# Patient Record
Sex: Female | Born: 1970 | Race: Black or African American | Hispanic: No | Marital: Married | Smoking: Never smoker
Health system: Southern US, Community
[De-identification: ages and names within clinical notes are randomized; demographics above are authoritative.]

## PROBLEM LIST (undated history)

## (undated) DIAGNOSIS — D649 Anemia, unspecified: Secondary | ICD-10-CM

---

## 2008-04-22 ENCOUNTER — Ambulatory Visit: Payer: Self-pay | Admitting: Family

## 2008-07-07 ENCOUNTER — Ambulatory Visit: Payer: Self-pay | Admitting: Gastroenterology

## 2009-02-11 ENCOUNTER — Emergency Department: Payer: Self-pay | Admitting: Emergency Medicine

## 2010-04-26 ENCOUNTER — Emergency Department: Payer: Self-pay | Admitting: Internal Medicine

## 2010-07-05 ENCOUNTER — Emergency Department: Payer: Self-pay | Admitting: Internal Medicine

## 2010-09-07 ENCOUNTER — Ambulatory Visit: Payer: Self-pay | Admitting: Family Medicine

## 2011-06-11 IMAGING — CR DG CHEST 2V
1 series · 2 of 2 positions shown · non-contrast
Comparison: none

REASON FOR EXAM: cp
COMMENTS:

[Series 1: view not recorded · 0.17mm/px · 2 of 2 slices shown]
[im 1/2]
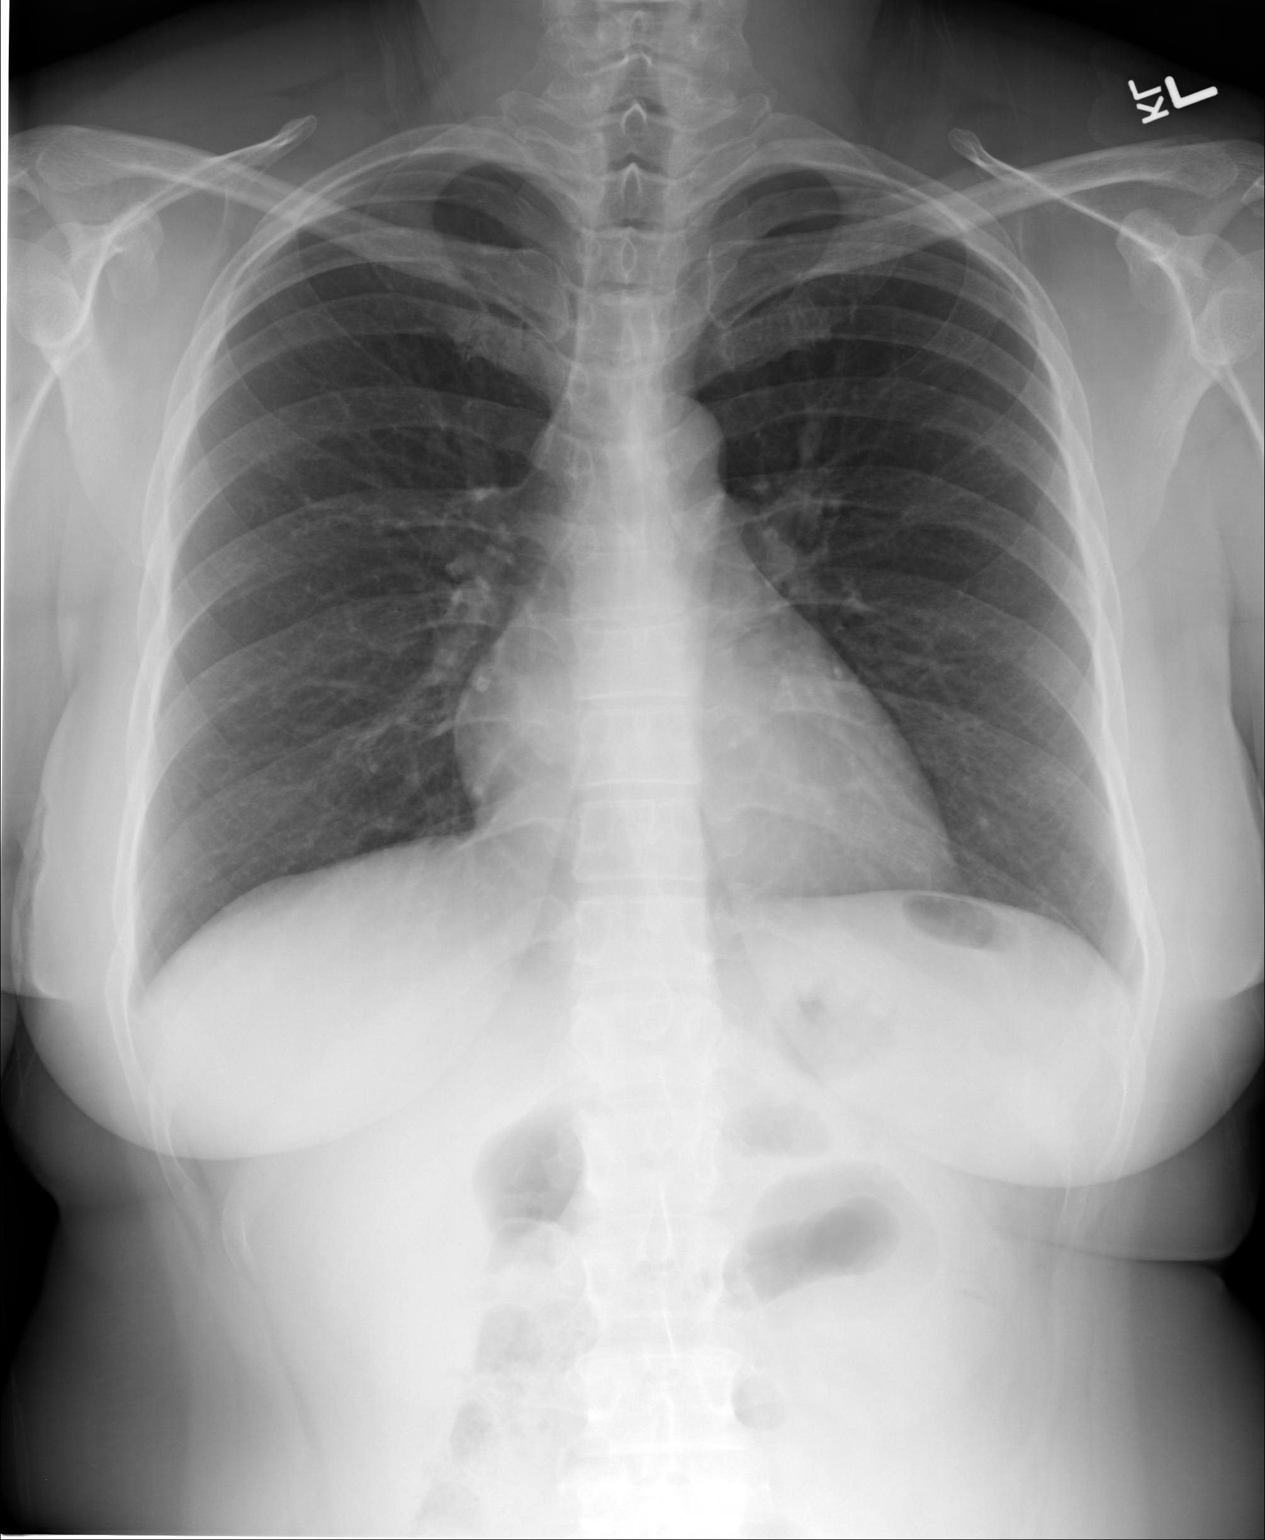
[im 2/2]
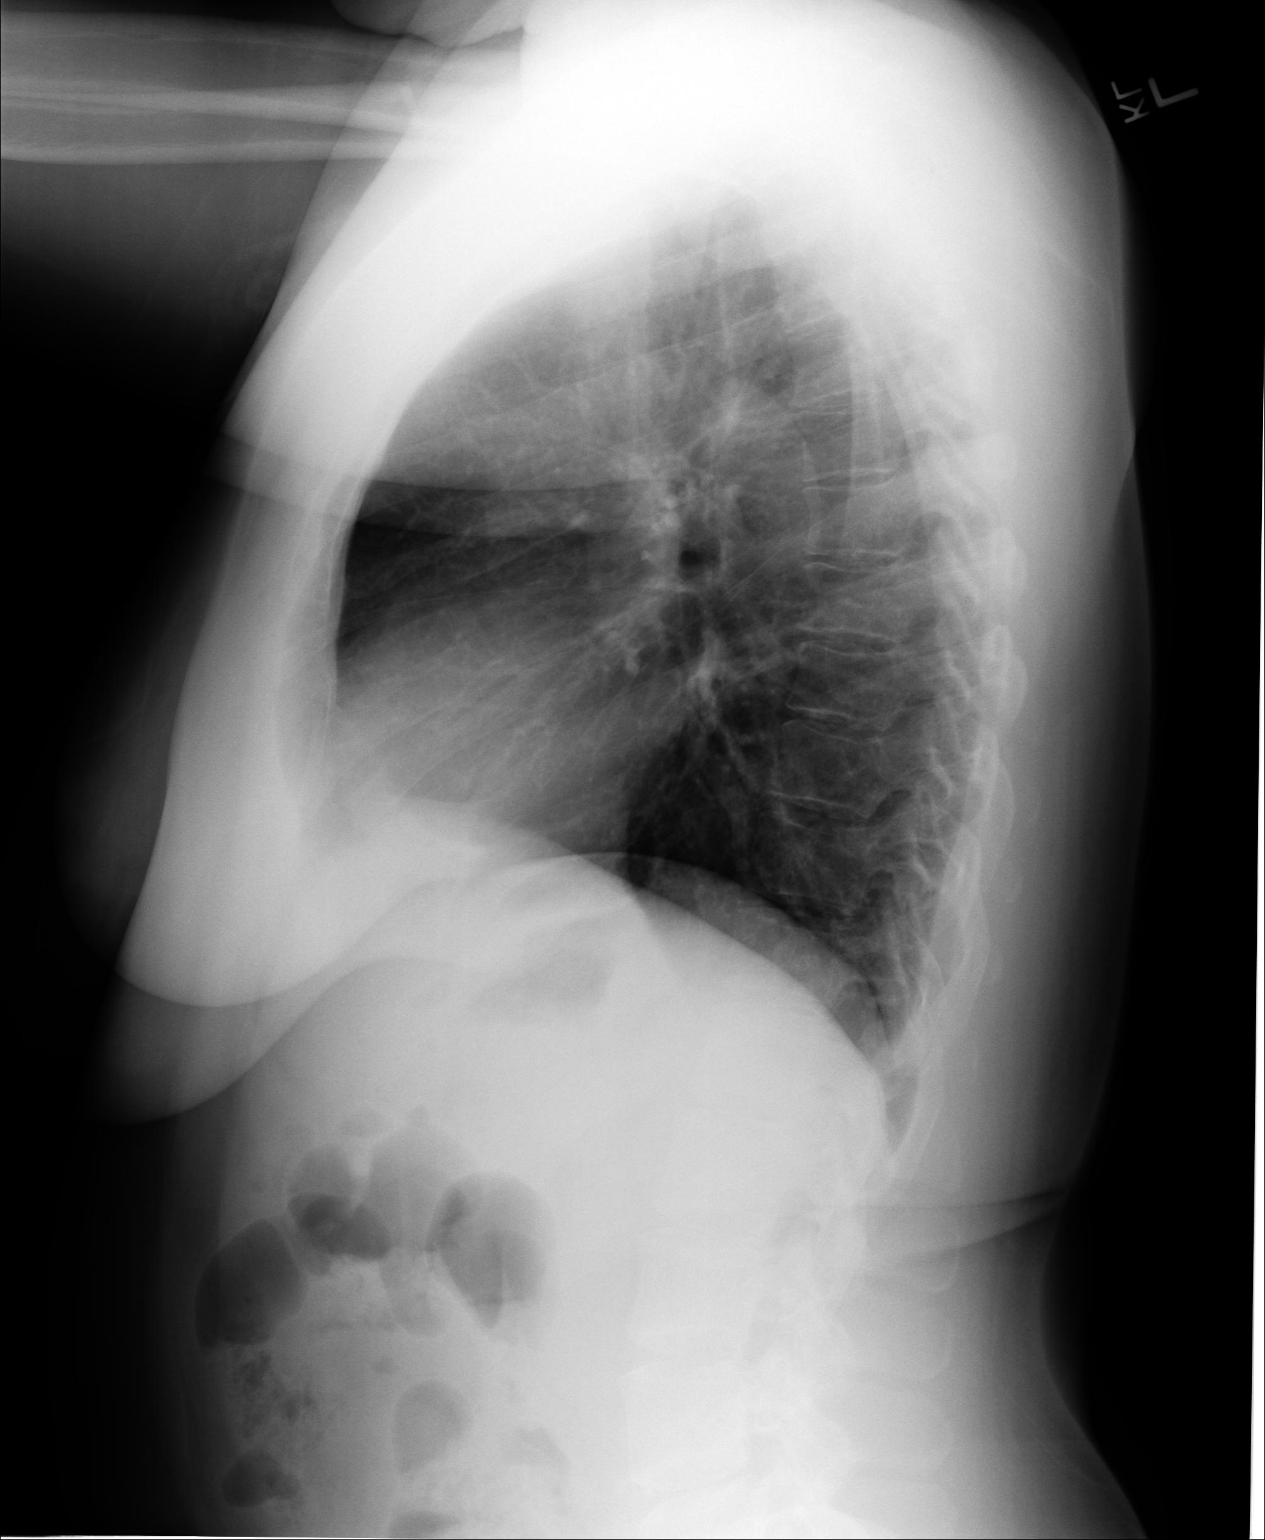

[2 of 2 positions shown; findings below may reference images not displayed]

PROCEDURE:     DXR - DXR CHEST PA (OR AP) AND LATERAL  - July 05, 2010  [DATE]

RESULT:     Comparison is made to the study 26 April, 2010.

The lungs are well-expanded and clear. The heart and mediastinal structures
are within the limits of normal. There is no evidence of a pneumothorax or
pneumomediastinum or pleural effusion. The bony thorax exhibits no acute
abnormality.
IMPRESSION: I do not see evidence of acute cardiopulmonary abnormality.

## 2011-08-14 IMAGING — CR RIGHT ANKLE - COMPLETE 3+ VIEW
1 series · 5 of 5 positions shown · non-contrast
Comparison: none

REASON FOR EXAM: bilateral ankle pain
COMMENTS:

PROCEDURE:     KDR - KDXR ANKLE RIGHT COMPLETE  - September 07, 2010  [DATE]
RESULT:     No fracture, dislocation or other acute bony abnormality is
identified. The ankle mortise is well maintained. No arthritic change about
the ankle is seen. No soft tissue foreign body is noted.

[Series 1: view not recorded · 0.17mm/px · 5 of 5 slices shown]
[im 1/5]
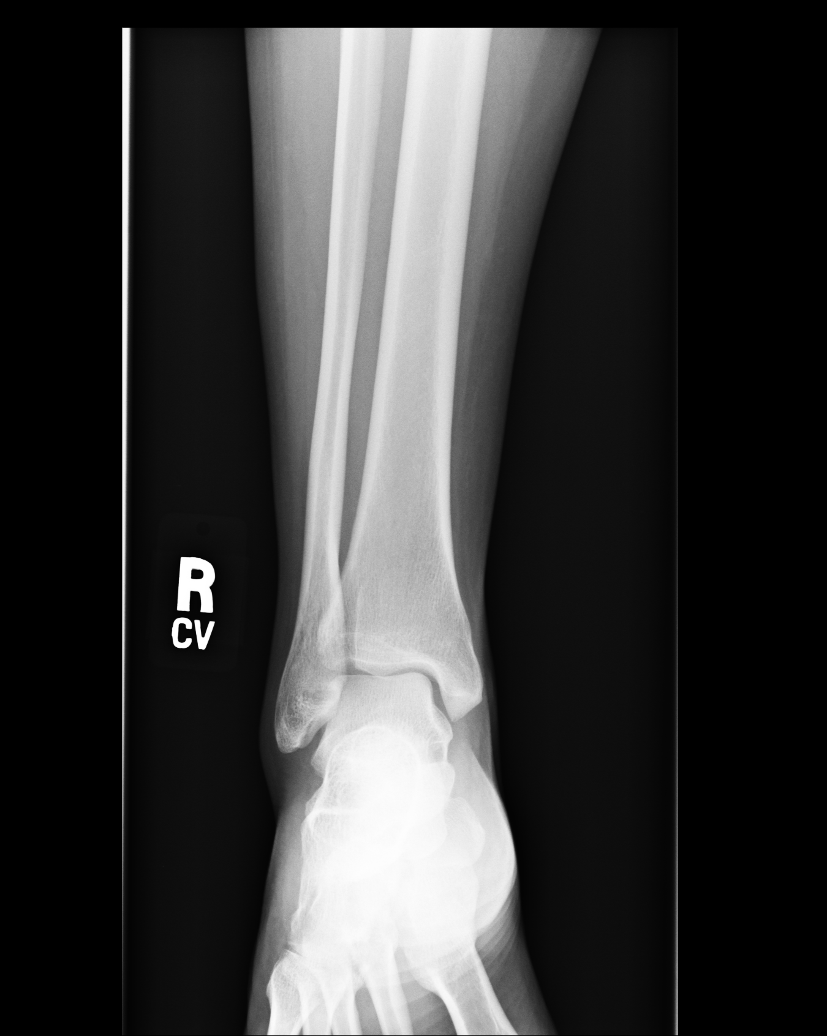
[im 2/5]
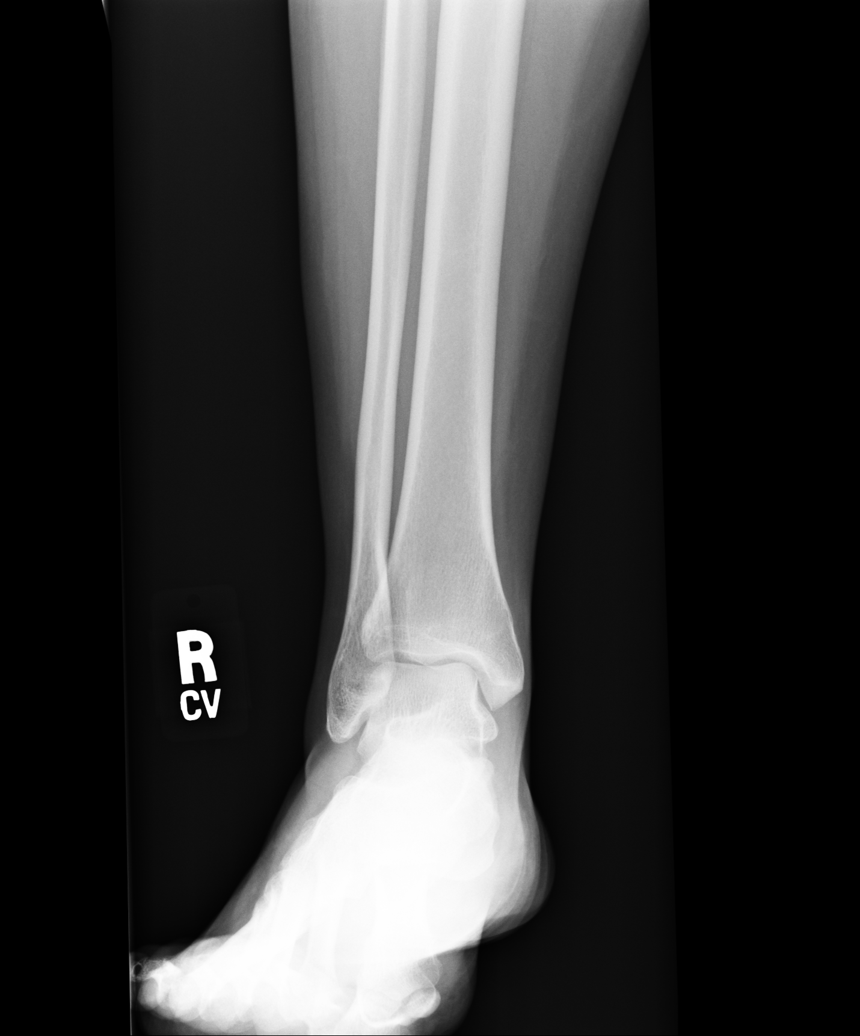
[im 3/5]
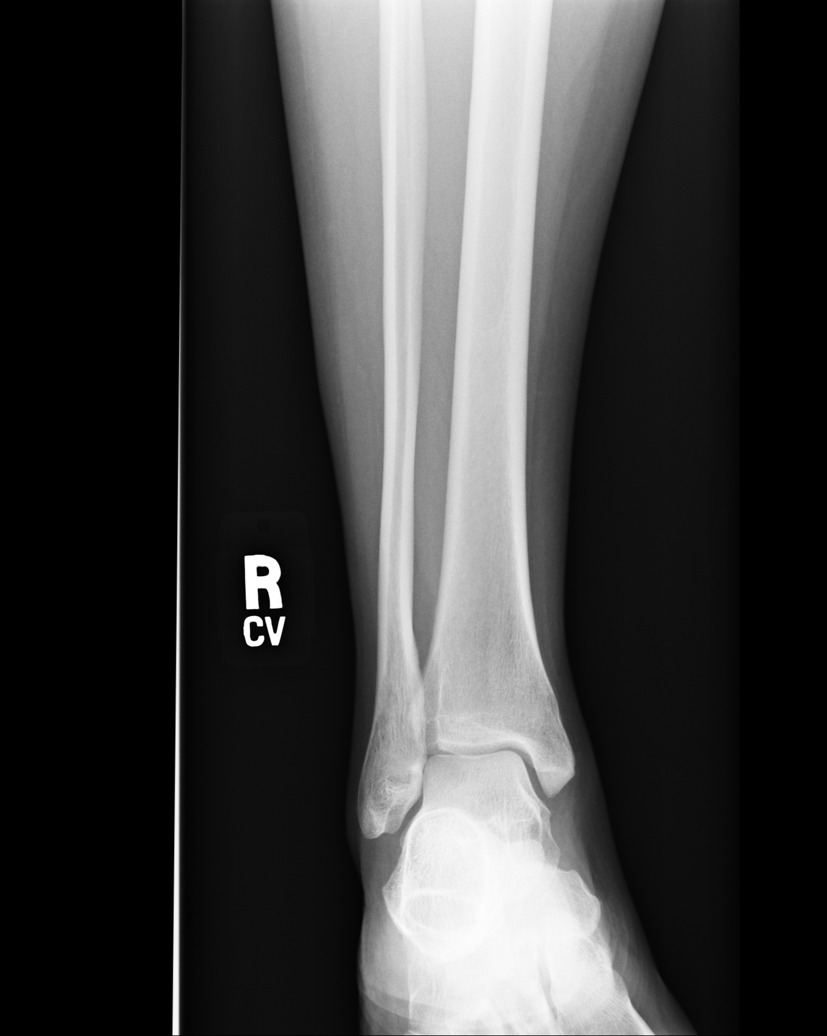
[im 4/5]
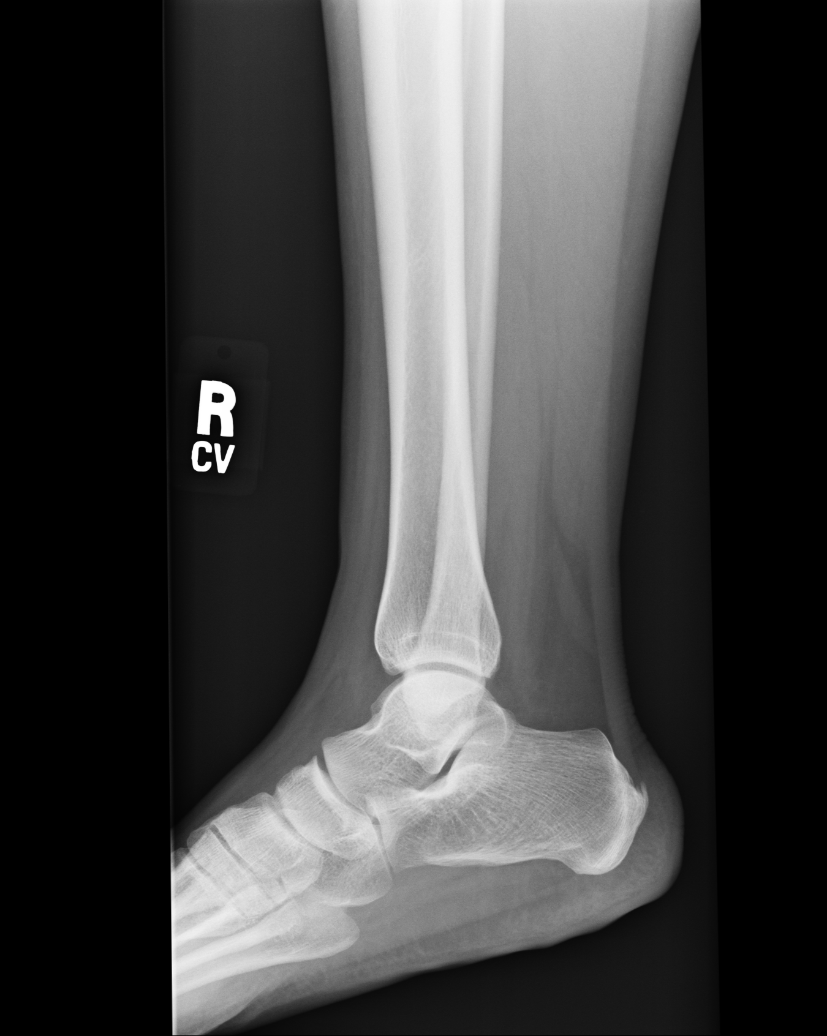
[im 5/5]
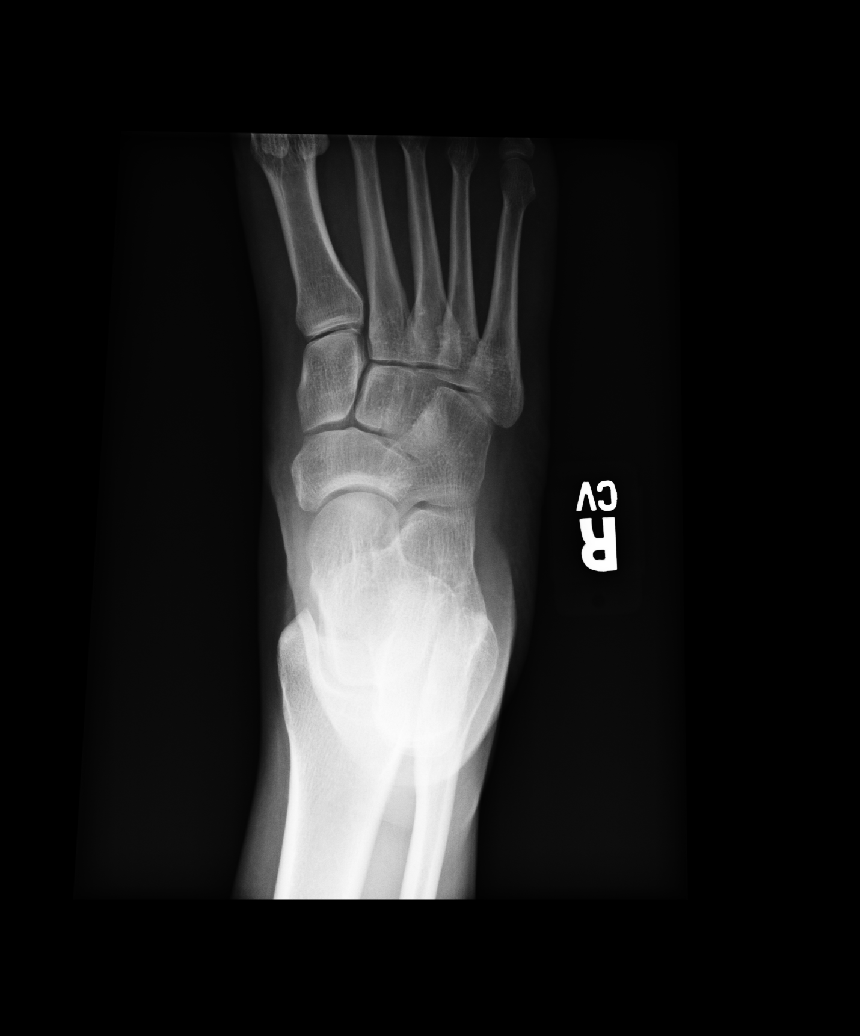

[5 of 5 positions shown; findings below may reference images not displayed]

IMPRESSION: 1.     No significant abnormalities are noted.

## 2020-04-06 ENCOUNTER — Encounter: Payer: Self-pay | Admitting: Emergency Medicine

## 2020-04-06 ENCOUNTER — Emergency Department

## 2020-04-06 ENCOUNTER — Other Ambulatory Visit: Payer: Self-pay

## 2020-04-06 DIAGNOSIS — R0789 Other chest pain: Secondary | ICD-10-CM | POA: Insufficient documentation

## 2020-04-06 LAB — CBC
HCT: 32.6 % — ABNORMAL LOW (ref 36.0–46.0)
Hemoglobin: 11 g/dL — ABNORMAL LOW (ref 12.0–15.0)
MCH: 27.5 pg (ref 26.0–34.0)
MCHC: 33.7 g/dL (ref 30.0–36.0)
MCV: 81.5 fL (ref 80.0–100.0)
Platelets: 320 10*3/uL (ref 150–400)
RBC: 4 MIL/uL (ref 3.87–5.11)
RDW: 12.5 % (ref 11.5–15.5)
WBC: 9.5 10*3/uL (ref 4.0–10.5)
nRBC: 0 % (ref 0.0–0.2)

## 2020-04-06 NOTE — ED Triage Notes (Signed)
Pt to ED from home c/o mid chest pain that is burning and radiating to left arm that started yesterday afternoon.  Movement makes pain worse, denies SOB or n/v/d.  Has had belching and gas at home.  Taking prilosec at home without relief.  Pt A&Ox4. Chest rise even and unlabored, skin WNL and in NAD at this time.

## 2020-04-07 ENCOUNTER — Emergency Department
Admission: EM | Admit: 2020-04-07 | Discharge: 2020-04-07 | Disposition: A | Discharge status: Home / Self Care | Attending: Emergency Medicine | Admitting: Emergency Medicine

## 2020-04-07 DIAGNOSIS — R0789 Other chest pain: Secondary | ICD-10-CM

## 2020-04-07 HISTORY — DX: Anemia, unspecified: D64.9

## 2020-04-07 LAB — BASIC METABOLIC PANEL
Anion gap: 10 (ref 5–15)
BUN: 10 mg/dL (ref 6–20)
CO2: 24 mmol/L (ref 22–32)
Calcium: 9.1 mg/dL (ref 8.9–10.3)
Chloride: 103 mmol/L (ref 98–111)
Creatinine, Ser: 0.62 mg/dL (ref 0.44–1.00)
GFR, Estimated: >60 mL/min (ref >60)
Glucose, Bld: 131 mg/dL — ABNORMAL HIGH (ref 70–99)
Potassium: 3.5 mmol/L (ref 3.5–5.1)
Sodium: 137 mmol/L (ref 135–145)

## 2020-04-07 LAB — HEPATIC FUNCTION PANEL
ALT: 19 U/L (ref 0–44)
AST: 20 U/L (ref 15–41)
Albumin: 4.1 g/dL (ref 3.5–5.0)
Alkaline Phosphatase: 39 U/L (ref 38–126)
Bilirubin, Direct: <0.1 mg/dL (ref 0.0–0.2)
Total Bilirubin: 0.7 mg/dL (ref 0.3–1.2)
Total Protein: 7.6 g/dL (ref 6.5–8.1)

## 2020-04-07 LAB — LIPASE, BLOOD: Lipase: 40 U/L (ref 11–51)

## 2020-04-07 LAB — TROPONIN I (HIGH SENSITIVITY)
Troponin I (High Sensitivity): 3 ng/L (ref <18)
Troponin I (High Sensitivity): <2 ng/L (ref <18)

## 2020-04-07 MED ORDER — ALUM & MAG HYDROXIDE-SIMETH 200-200-20 MG/5ML PO SUSP
30.0000 mL | Freq: Once | ORAL | Status: AC
  Administered 2020-04-07: 30 mL via ORAL
  Filled 2020-04-07: qty 30

## 2020-04-07 MED ORDER — LIDOCAINE VISCOUS HCL 2 % MT SOLN
15.0000 mL | Freq: Once | OROMUCOSAL | Status: AC
  Administered 2020-04-07: 15 mL via ORAL
  Filled 2020-04-07: qty 15

## 2020-04-07 NOTE — ED Notes (Signed)
Patient discharged to home per MD order. Patient in stable condition, and deemed medically cleared by ED provider for discharge. Discharge instructions reviewed with patient/family using "Teach Back"; verbalized understanding of medication education and administration, and information about follow-up care. Denies further concerns. ° °

## 2020-04-07 NOTE — ED Notes (Signed)
Pt states she thinks her acid refulx is the cause of the pain. Pt states she has a history of the reflux. Pt states the only reason she came in for was because the chest pain did radiate to the left arm for a few moments, but currently does not have arm pain.

## 2020-04-07 NOTE — Discharge Instructions (Addendum)
You have no signs of heart attack or strain/damage to your heart.  Continue to take your Prilosec.   Return to the ED with any worsening chest pains, particularly with fevers or passing out. Return to the ED with any worsening upper belly pain after eating

## 2020-04-07 NOTE — ED Provider Notes (Signed)
[facility] Emergency Department Provider Note ____________________________________________   Event Date/Time   First MD Initiated Contact with Patient 04/07/20 0151     (approximate)  I have reviewed the triage vital signs and the nursing notes.  HISTORY  Chief Complaint Chest Pain   HPI Samantha Spears is a 50 y.o. femalewho presents to the ED for evaluation of chest pain.   Chart review indicates history of obesity and chronic anemia.  No cardiac history.  Self-reports a history of GERD for which she takes omeprazole regularly.  Patient presents to the ED with substernal chest pain that is burning in nature, worse after eating, with associated belching and gaseous bloating sensation.  She reports the pain is worse postprandially, but denies any emesis, diarrhea, RUQ pain, cough, fever, syncope or trauma.  Currently reporting 4/10 intensity burning discomfort.  She reports discussing this with her husband, presents to the ED for evaluation of heart attack.   Past Medical History:  Diagnosis Date  . Anemia     There are no problems to display for this patient.   History reviewed. No pertinent surgical history.  Prior to Admission medications   Not on File    Allergies Asa [aspirin]  History reviewed. No pertinent family history.  Social History Social History   Tobacco Use  . Smoking status: Never Smoker  . Smokeless tobacco: Never Used  Substance Use Topics  . Alcohol use: Yes    Alcohol/week: 5.0 standard drinks    Types: 5 Glasses of wine per week    Comment: friday and saturday  . Drug use: Never    Review of Systems  Constitutional: No fever/chills Eyes: No visual changes. ENT: No sore throat. Cardiovascular: Positive for chest pain. Respiratory: Denies shortness of breath. Gastrointestinal: No abdominal pain.  No nausea, no vomiting.  No diarrhea.  No constipation. Genitourinary: Negative for dysuria. Musculoskeletal:  Negative for back pain. Skin: Negative for rash. Neurological: Negative for headaches, focal weakness or numbness.  ____________________________________________   PHYSICAL EXAM:  VITAL SIGNS: Vitals:   04/07/20 0125 04/07/20 0224  BP: 140/75 124/76  Pulse: 88 83  Resp: 18 20  Temp:    SpO2: 100% 98%    Constitutional: Alert and oriented. Well appearing and in no acute distress. Eyes: Conjunctivae are normal. PERRL. EOMI. Head: Atraumatic. Nose: No congestion/rhinnorhea. Mouth/Throat: Mucous membranes are moist.  Oropharynx non-erythematous. Neck: No stridor. No cervical spine tenderness to palpation. Cardiovascular: Normal rate, regular rhythm. Grossly normal heart sounds.  Good peripheral circulation. Respiratory: Normal respiratory effort.  No retractions. Lungs CTAB. Gastrointestinal: Soft , nondistended, nontender to palpation. No CVA tenderness.  Benign abdomen throughout Musculoskeletal: No lower extremity tenderness nor edema.  No joint effusions. No signs of acute trauma. Neurologic:  Normal speech and language. No gross focal neurologic deficits are appreciated. No gait instability noted. Skin:  Skin is warm, dry and intact. No rash noted. Psychiatric: Mood and affect are normal. Speech and behavior are normal. ____________________________________________   LABS (all labs ordered are listed, but only abnormal results are displayed)  Labs Reviewed  BASIC METABOLIC PANEL - Abnormal; Notable for the following components:      Result Value   Glucose, Bld 131 (*)    All other components within normal limits  CBC - Abnormal; Notable for the following components:   Hemoglobin 11.0 (*)    HCT 32.6 (*)    All other components within normal limits  HEPATIC FUNCTION PANEL  LIPASE, BLOOD  POC  URINE PREG, ED  TROPONIN I (HIGH SENSITIVITY)  TROPONIN I (HIGH SENSITIVITY)   ____________________________________________  12 Lead EKG  Sinus rhythm, rate of 95 bpm.  Normal  axis and intervals.  No evidence of acute ischemia. ____________________________________________  RADIOLOGY  ED MD interpretation: 2 view CXR reviewed by me without evidence of acute cardiopulmonary pathology.  Official radiology report(s): DG Chest 2 View  Result Date: 04/06/2020 CLINICAL DATA:  50 year old female with chest pain. EXAM: CHEST - 2 VIEW COMPARISON:  Chest radiograph dated 07/05/2010. FINDINGS: The heart size and mediastinal contours are within normal limits. Both lungs are clear. The visualized skeletal structures are unremarkable. IMPRESSION: No active cardiopulmonary disease. Electronically Signed   By: Elgie Collard M.D.   On: 04/06/2020 22:50    ____________________________________________   PROCEDURES and INTERVENTIONS  Procedure(s) performed (including Critical Care):  .1-3 Lead EKG Interpretation Performed by: Delton Prairie, MD Authorized by: Delton Prairie, MD     Interpretation: normal     ECG rate:  80   ECG rate assessment: normal     Rhythm: sinus rhythm     Ectopy: none     Conduction: normal      Medications  alum & mag hydroxide-simeth (MAALOX/MYLANTA) 200-200-20 MG/5ML suspension 30 mL (30 mLs Oral Given 04/07/20 0208)    And  lidocaine (XYLOCAINE) 2 % viscous mouth solution 15 mL (15 mLs Oral Given 04/07/20 0208)    ____________________________________________   MDM / ED COURSE   50 year old woman without cardiac history presents to the ED with chest pain, likely of GI etiology, and amenable to outpatient management.  Normal vitals on room air.  Exam without evidence of acute pathology.  Benign abdomen without RUQ tenderness to suggest acute cholelithiasis or -cystitis.  Blood work without evidence of acute pathology or biliary obstruction.  EKG is nonischemic and 2 high-sensitivity troponins are negative.  Patient's pain is resolved after GI cocktail.  I urged her to continue her antiacid at home and follow-up with her PCP.  We discussed  return precautions for the ED.   Clinical Course as of 04/07/20 0448  Wed Apr 07, 2020  0221 Reassessed.  Patient reports resolution of symptoms after GI cocktail.  We discussed return precautions for the ED [DS]    Clinical Course User Index [DS] Delton Prairie, MD    ____________________________________________   FINAL CLINICAL IMPRESSION(S) / ED DIAGNOSES  Final diagnoses:  Other chest pain     ED Discharge Orders    None       Teigen Parslow Katrinka Blazing   Note:  This document was prepared using Dragon voice recognition software and may include unintentional dictation errors.   Delton Prairie, MD 04/07/20 [phone]

## 2021-03-13 IMAGING — CR DG CHEST 2V
2 series · 2 of 2 positions shown · non-contrast
Comparison: Chest radiograph dated 07/05/2010.

CLINICAL DATA: 49-year-old female with chest pain.

EXAM:
CHEST - 2 VIEW

[chest pa]
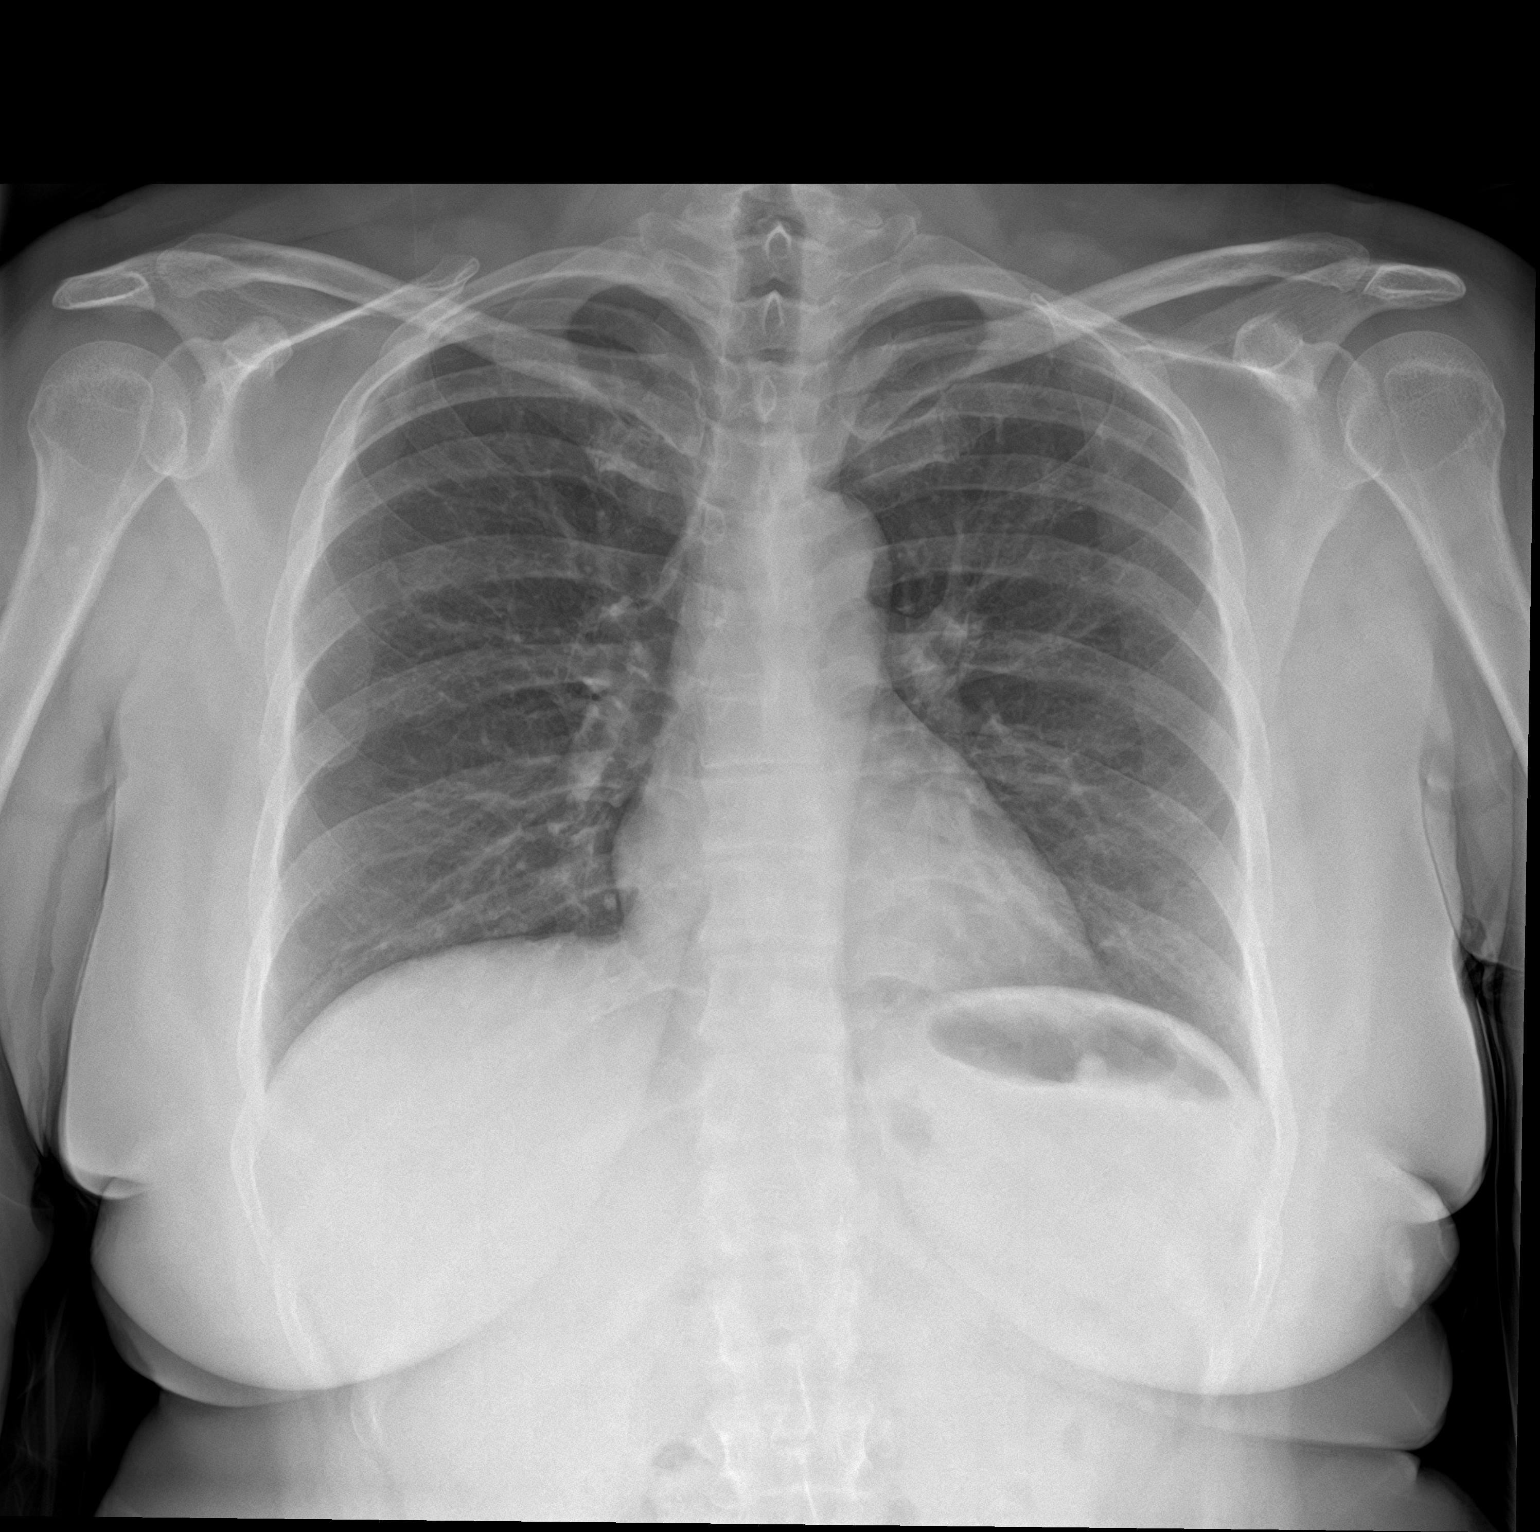

[chest lat]
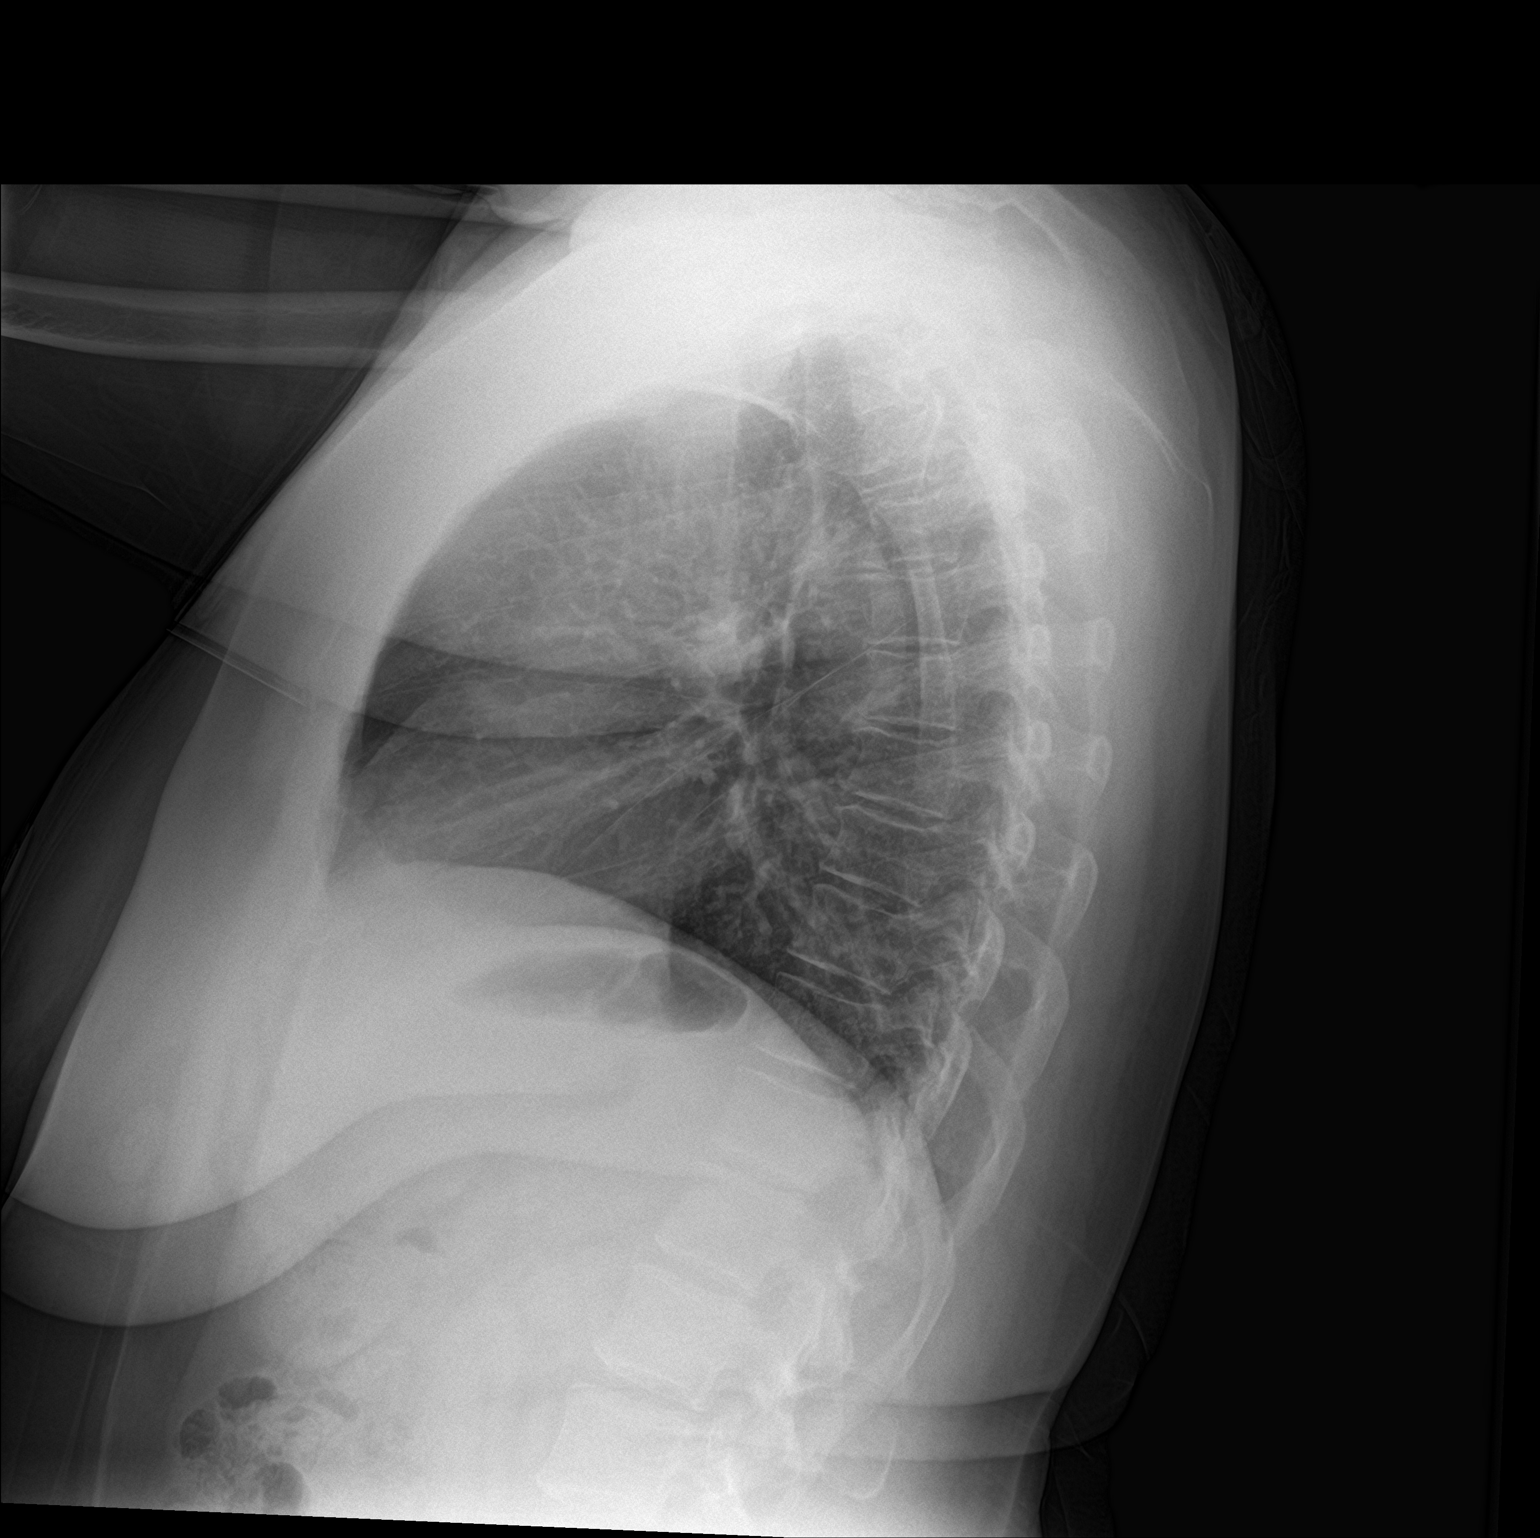

[2 of 2 positions shown; findings below may reference images not displayed]

FINDINGS: The heart size and mediastinal contours are within normal limits.
Both lungs are clear. The visualized skeletal structures are
unremarkable.
IMPRESSION: No active cardiopulmonary disease.

## 2021-05-01 ENCOUNTER — Encounter: Payer: Self-pay | Admitting: Emergency Medicine

## 2021-05-01 ENCOUNTER — Emergency Department
Admission: EM | Admit: 2021-05-01 | Discharge: 2021-05-01 | Disposition: A | Discharge status: Home / Self Care | Attending: Emergency Medicine | Admitting: Emergency Medicine

## 2021-05-01 ENCOUNTER — Other Ambulatory Visit: Payer: Self-pay

## 2021-05-01 DIAGNOSIS — J020 Streptococcal pharyngitis: Secondary | ICD-10-CM | POA: Diagnosis not present

## 2021-05-01 DIAGNOSIS — Z20822 Contact with and (suspected) exposure to covid-19: Secondary | ICD-10-CM | POA: Insufficient documentation

## 2021-05-01 DIAGNOSIS — J029 Acute pharyngitis, unspecified: Secondary | ICD-10-CM | POA: Diagnosis present

## 2021-05-01 LAB — RESP PANEL BY RT-PCR (FLU A&B, COVID) ARPGX2
Influenza A by PCR: NEGATIVE
Influenza B by PCR: NEGATIVE
SARS Coronavirus 2 by RT PCR: NEGATIVE

## 2021-05-01 LAB — GROUP A STREP BY PCR: Group A Strep by PCR: DETECTED — AB

## 2021-05-01 MED ORDER — LIDOCAINE VISCOUS HCL 2 % MT SOLN: 15.0000 mL | OROMUCOSAL | 0 refills | Status: AC | PRN

## 2021-05-01 MED ORDER — ACETAMINOPHEN 500 MG PO TABS
1000.0000 mg | ORAL_TABLET | Freq: Once | ORAL | Status: AC
Start: 2021-05-01 — End: 2021-05-01
  Administered 2021-05-01: 1000 mg via ORAL
  Filled 2021-05-01: qty 2

## 2021-05-01 MED ORDER — AMOXICILLIN 500 MG PO TABS: 500.0000 mg | ORAL_TABLET | Freq: Two times a day (BID) | ORAL | 0 refills | Status: AC

## 2021-05-01 NOTE — ED Provider Notes (Signed)
[facility] Provider Note    None    (approximate)   History   Sore Throat and Fever   HPI  Samantha Spears is a 51 y.o. female with history of anemia and as listed in EMR presents to the emergency department for treatment and evaluation of sore throat fever.  Symptoms started 2 days ago.  Exposures to COVID, influenza, and strep throat while at work.  No alleviating measures attempted prior to arrival.      Physical Exam   Triage Vital Signs: ED Triage Vitals  Enc Vitals Group     BP 05/01/21 0827 121/84     Pulse Rate 05/01/21 0827 (!) 139     Resp 05/01/21 0827 20     Temp 05/01/21 0827 (!) 101.7 F (38.7 C)     Temp Source 05/01/21 0827 Oral     SpO2 05/01/21 0827 95 %     Weight 05/01/21 0824 176 lb 5.9 oz (80 kg)     Height 05/01/21 0824 5\' 2"  (1.575 m)     Head Circumference --      Peak Flow --      Pain Score 05/01/21 0824 6     Pain Loc --      Pain Edu? --      Excl. in [place]? --     Most recent vital signs: Vitals:   05/01/21 0941 05/01/21 1016  BP: 120/80 132/77  Pulse: (!) 129 (!) 123  Resp: 18 20  Temp:  99.7 F (37.6 C)  SpO2: 96% 95%    General: Awake, no distress.  CV:  Good peripheral perfusion.  Resp:  Normal effort.  Abd:  No distention.  Other:  Posterior oropharynx erythematous.  Unable to visualize tonsils.  Uvula is midline.  No airway edema.  Patient tolerating secretions.   ED Results / Procedures / Treatments   Labs (all labs ordered are listed, but only abnormal results are displayed) Labs Reviewed  GROUP A STREP BY PCR - Abnormal; Notable for the following components:      Result Value   Group A Strep by PCR DETECTED (*)    All other components within normal limits  RESP PANEL BY RT-PCR (FLU A&B, COVID) [id number]     EKG  Not indicated   RADIOLOGY  Image and radiology report reviewed by me.  Not indicated  PROCEDURES:  Critical Care performed: No  Procedures   MEDICATIONS  ORDERED IN ED: Medications  acetaminophen (TYLENOL) tablet 1,000 mg (1,000 mg Oral Given 05/01/21 0940)     IMPRESSION / MDM / ASSESSMENT AND PLAN / ED COURSE   I have reviewed the triage note.  Differential diagnosis includes, but is not limited to, COVID, influenza, strep, viral syndrome.  51 year old female presenting to the emergency department for treatment and evaluation of sore throat and fever.  See HPI for further details.  Tachycardic and febrile on arrival.  Influenza and COVID screening is negative.  Strep PCR is positive.  Tachycardia and fever has decreased after Tylenol.  Patient would like to be discharged home.  She will be prescribed amoxicillin and viscous lidocaine.  Work excuse provided for the next 24 hours.  She is to follow-up with her primary care provider or return to the emergency department for symptoms of change or worsen.      FINAL CLINICAL IMPRESSION(S) / ED DIAGNOSES   Final diagnoses:  Strep pharyngitis     Rx / DC Orders   ED  Discharge Orders          Ordered    amoxicillin (AMOXIL) 500 MG tablet  2 times daily        05/01/21 0933    lidocaine (XYLOCAINE) 2 % solution  As needed        05/01/21 [id number]             Note:  This document was prepared using Dragon voice recognition software and may include unintentional dictation errors.   Victorino Dike, FNP 05/01/21 1855    Vanessa Rembrandt, MD 05/03/21 1501

## 2021-05-01 NOTE — ED Triage Notes (Signed)
Pt reports started with sore throat and fever Friday. Pt reports pain worse when swallowing. Pt states recent exposure to illnesses at work including strep throat. Pt denies fever this am. Pt denies cough, congestion, sob,or pain.

## 2021-05-01 NOTE — ED Notes (Signed)
See triage note  presents with fever and sore throat  states sore throat since Friday  denies any cough

## 2022-10-02 ENCOUNTER — Emergency Department
Admission: EM | Admit: 2022-10-02 | Discharge: 2022-10-02 | Disposition: A | Discharge status: Home / Self Care | Attending: Emergency Medicine | Admitting: Emergency Medicine

## 2022-10-02 ENCOUNTER — Other Ambulatory Visit: Payer: Self-pay

## 2022-10-02 ENCOUNTER — Emergency Department

## 2022-10-02 DIAGNOSIS — Z1152 Encounter for screening for COVID-19: Secondary | ICD-10-CM | POA: Diagnosis not present

## 2022-10-02 DIAGNOSIS — R0789 Other chest pain: Secondary | ICD-10-CM | POA: Diagnosis not present

## 2022-10-02 DIAGNOSIS — R0602 Shortness of breath: Secondary | ICD-10-CM | POA: Insufficient documentation

## 2022-10-02 LAB — COMPREHENSIVE METABOLIC PANEL
ALT: 26 U/L (ref 0–44)
AST: 33 U/L (ref 15–41)
Albumin: 4.3 g/dL (ref 3.5–5.0)
Alkaline Phosphatase: 49 U/L (ref 38–126)
Anion gap: 8 (ref 5–15)
BUN: 9 mg/dL (ref 6–20)
CO2: 28 mmol/L (ref 22–32)
Calcium: 9.2 mg/dL (ref 8.9–10.3)
Chloride: 103 mmol/L (ref 98–111)
Creatinine, Ser: 0.68 mg/dL (ref 0.44–1.00)
GFR, Estimated: >60 mL/min (ref >60)
Glucose, Bld: 126 mg/dL — ABNORMAL HIGH (ref 70–99)
Potassium: 3.6 mmol/L (ref 3.5–5.1)
Sodium: 139 mmol/L (ref 135–145)
Total Bilirubin: 1.5 mg/dL — ABNORMAL HIGH (ref 0.3–1.2)
Total Protein: 8.4 g/dL — ABNORMAL HIGH (ref 6.5–8.1)

## 2022-10-02 LAB — CBC WITH DIFFERENTIAL/PLATELET
Abs Immature Granulocytes: 0.08 10*3/uL — ABNORMAL HIGH (ref 0.00–0.07)
Basophils Absolute: 0.1 10*3/uL (ref 0.0–0.1)
Basophils Relative: 0 %
Eosinophils Absolute: 0 10*3/uL (ref 0.0–0.5)
Eosinophils Relative: 0 %
HCT: 33.9 % — ABNORMAL LOW (ref 36.0–46.0)
Hemoglobin: 11.6 g/dL — ABNORMAL LOW (ref 12.0–15.0)
Immature Granulocytes: 0 %
Lymphocytes Relative: 8 %
Lymphs Abs: 1.9 10*3/uL (ref 0.7–4.0)
MCH: 27 pg (ref 26.0–34.0)
MCHC: 34.2 g/dL (ref 30.0–36.0)
MCV: 79 fL — ABNORMAL LOW (ref 80.0–100.0)
Monocytes Absolute: 0.9 10*3/uL (ref 0.1–1.0)
Monocytes Relative: 4 %
Neutro Abs: 19.8 10*3/uL — ABNORMAL HIGH (ref 1.7–7.7)
Neutrophils Relative %: 88 %
Platelets: 364 10*3/uL (ref 150–400)
RBC: 4.29 MIL/uL (ref 3.87–5.11)
RDW: 12.8 % (ref 11.5–15.5)
WBC: 22.7 10*3/uL — ABNORMAL HIGH (ref 4.0–10.5)
nRBC: 0 % (ref 0.0–0.2)

## 2022-10-02 LAB — TROPONIN I (HIGH SENSITIVITY): Troponin I (High Sensitivity): 6 ng/L (ref <18)

## 2022-10-02 LAB — SARS CORONAVIRUS 2 BY RT PCR: SARS Coronavirus 2 by RT PCR: NEGATIVE

## 2022-10-02 MED ORDER — HYDROCODONE-ACETAMINOPHEN 5-325 MG PO TABS: 1.0000 | ORAL_TABLET | Freq: Four times a day (QID) | ORAL | 0 refills | Status: AC | PRN

## 2022-10-02 MED ORDER — SODIUM CHLORIDE 0.9 % IV BOLUS
1000.0000 mL | Freq: Once | INTRAVENOUS | Status: AC
  Administered 2022-10-02: 1000 mL via INTRAVENOUS

## 2022-10-02 MED ORDER — IOHEXOL 350 MG/ML SOLN
100.0000 mL | Freq: Once | INTRAVENOUS | Status: AC | PRN
  Administered 2022-10-02: 100 mL via INTRAVENOUS

## 2022-10-02 MED ORDER — HYDROCODONE-ACETAMINOPHEN 5-325 MG PO TABS
1.0000 | ORAL_TABLET | Freq: Once | ORAL | Status: AC
  Administered 2022-10-02: 1 via ORAL
  Filled 2022-10-02: qty 1

## 2022-10-02 MED ORDER — ALUM & MAG HYDROXIDE-SIMETH 200-200-20 MG/5ML PO SUSP
30.0000 mL | Freq: Once | ORAL | Status: AC
  Administered 2022-10-02: 30 mL via ORAL
  Filled 2022-10-02: qty 30

## 2022-10-02 MED ORDER — LIDOCAINE 5 % EX PTCH
1.0000 | MEDICATED_PATCH | Freq: Once | CUTANEOUS | Status: DC
  Administered 2022-10-02: 1 via TRANSDERMAL
  Filled 2022-10-02: qty 1

## 2022-10-02 NOTE — Discharge Instructions (Addendum)
You were seen in the ER today for your shortness of breath.  We fortunately did not find an emergency cause for this.  Please make sure you are eating and staying hydrated is much as possible.  You can take Tylenol for pain control.  If you have breakthrough pain, I sent a short course of pain medicine to your pharmacy.  Do not operate machinery when taking this.  Follow your primary care doctor for further evaluation.  Return to the ER for new or worsening symptoms.

## 2022-10-02 NOTE — ED Triage Notes (Signed)
Pt states she has had generalized body aches and shortness of breath for the past month. Pt has been seen by pcp and given albuterol and prednisone pt has been taking with no relief.

## 2022-10-02 NOTE — ED Provider Notes (Signed)
[facility] Provider Note    Event Date/Time   First MD Initiated Contact with Patient 10/02/22 1716     (approximate)   History   Shortness of Breath   HPI  Samantha Spears is a 52 y.o. female with history of anemia, GERD presenting to the emergency department for evaluation of shortness of breath.  Patient reports that about a month ago she had onset of shortness of breath and was seen by her primary care doctor.  She was given albuterol and prednisone.  She did feel that her symptoms had improved, but earlier today had recurrent shortness of breath.  Does report some chest pain over her left chest wall.  No history of asthma or COPD.  Reports that she has not had much to eat or drink today in the setting of her new symptoms, but has been tolerating normal oral intake over the last several days.  No abdominal pain, vomiting, diarrhea, dysuria, urinary frequency.  Does report some ongoing reflux symptoms, taking a PPI.  Instructed not to take NSAIDs.  Scheduled for endoscopy later this week.      Physical Exam   Triage Vital Signs: ED Triage Vitals  Encounter Vitals Group     BP 10/02/22 1604 (!) 143/101     Systolic BP Percentile --      Diastolic BP Percentile --      Pulse Rate 10/02/22 1604 (!) 140     Resp 10/02/22 1604 20     Temp 10/02/22 1604 98.9 F (37.2 C)     Temp Source 10/02/22 1730 Oral     SpO2 10/02/22 1604 98 %     Weight 10/02/22 1603 174 lb (78.9 kg)     Height 10/02/22 1603 5\' 2"  (1.575 m)     Head Circumference --      Peak Flow --      Pain Score 10/02/22 1603 4     Pain Loc --      Pain Education --      Exclude from Growth Chart --     Most recent vital signs: Vitals:   10/02/22 2145 10/02/22 2219  BP:  139/77  Pulse: (!) 107 (!) 103  Resp:  19  Temp:  99.6 F (37.6 C)  SpO2: 98% 98%     General: Awake, interactive  CV:  Tachycardic with regular rhythm, normal peripheral perfusion Resp:  Lungs clear,  unlabored respirations, no appreciable wheezing, good airflow Chest wall:  There is tenderness to palpation along the left anterior medial chest wall including along the costochondral junction of some of the ribs Abd:  Soft, nondistended.  Neuro:  Symmetric facial movement, fluid speech   ED Results / Procedures / Treatments   Labs (all labs ordered are listed, but only abnormal results are displayed) Labs Reviewed  CBC WITH DIFFERENTIAL/PLATELET - Abnormal; Notable for the following components:      Result Value   WBC 22.7 (*)    Hemoglobin 11.6 (*)    HCT 33.9 (*)    MCV 79.0 (*)    Neutro Abs 19.8 (*)    Abs Immature Granulocytes 0.08 (*)    All other components within normal limits  COMPREHENSIVE METABOLIC PANEL - Abnormal; Notable for the following components:   Glucose, Bld 126 (*)    Total Protein 8.4 (*)    Total Bilirubin 1.5 (*)    All other components within normal limits  SARS CORONAVIRUS 2 BY RT PCR  TROPONIN  I (HIGH SENSITIVITY)     EKG EKG independently reviewed interpreted by myself (ER attending) demonstrates:  EKG demonstrates sinus tachycardia at a rate of 131, PR 140, QRS 72, QTc 419, no acute ST changes  RADIOLOGY Imaging independently reviewed and interpreted by myself demonstrates:  CXR without focal pneumonia CTA of the chest without evidence of PE, radiology does not note pneumonia or other acute intrathoracic process  PROCEDURES:  Critical Care performed: No  Procedures   MEDICATIONS ORDERED IN ED: Medications  lidocaine (LIDODERM) 5 % 1 patch (1 patch Transdermal Patch Applied 10/02/22 2018)  sodium chloride 0.9 % bolus 1,000 mL (0 mLs Intravenous Stopped 10/02/22 2146)  iohexol (OMNIPAQUE) 350 MG/ML injection 100 mL (100 mLs Intravenous Contrast Given 10/02/22 1848)  HYDROcodone-acetaminophen (NORCO/VICODIN) 5-325 MG per tablet 1 tablet (1 tablet Oral Given 10/02/22 2017)  alum & mag hydroxide-simeth (MAALOX/MYLANTA) 200-200-20 MG/5ML  suspension 30 mL (30 mLs Oral Given 10/02/22 2017)     IMPRESSION / MDM / ASSESSMENT AND PLAN / ED COURSE  I reviewed the triage vital signs and the nursing notes.  Differential diagnosis includes, but is not limited to, pneumonia, bronchitis, PE, ACS, musculoskeletal strain  Patient's presentation is most consistent with acute presentation with potential threat to life or bodily function.  52 year old female presenting with shortness of breath, found to be significantly tachycardic on presentation.  Lab work is notable for leukocytosis with white cell count of 22.7. Shortness of breath as in HPI, patient denying any other infectious symptoms.  Do think this may partly be related to her recent steroid use.  Her hemoglobin is stable compared to prior.  Labs otherwise without severe derangement.  Chest x-Cornelis Kluver and CT of the chest without acute pulmonary findings.  Do think some of patient's discomfort may be related to musculoskeletal strain, possible inflammatory component given tenderness over costochondral junctions.  Unfortunately, patient does also have ongoing issues with GERD with a scheduled endoscopy and has specifically been instructed to avoid NSAIDs.  Has been taking Tylenol with limited benefit.  Will trial Maalox, lidocaine patch, and Norco to see if this improves patient's symptoms.  With fluid resuscitation, she does have improvement in her heart rate.  On reevaluation, patient with significant rooming in her symptoms.  Her heart rate has improved to under 110.  Possible musculoskeletal component to her shortness of breath, consideration for GI related symptoms as well.  Patient is comfortable with discharge home.  As she was instructed to avoid NSAIDs, will DC with short course of pain medicine.  Strict return precautions provided.  Patient discharged stable condition.     FINAL CLINICAL IMPRESSION(S) / ED DIAGNOSES   Final diagnoses:  Shortness of breath  Chest wall pain     Rx  / DC Orders   ED Discharge Orders          Ordered    HYDROcodone-acetaminophen (NORCO) 5-325 MG tablet  Every 6 hours PRN        10/02/22 2145             Note:  This document was prepared using Dragon voice recognition software and may include unintentional dictation errors.   Trinna Post, MD 10/02/22 [phone]
